# Patient Record
Sex: Male | Born: 1935 | Hispanic: Refuse to answer | Marital: Married
Health system: Midwestern US, Academic
[De-identification: ages and names within clinical notes are randomized; demographics above are authoritative.]

---

## 2017-06-25 ENCOUNTER — Encounter: Admit: 2017-06-25 | Discharge: 2017-06-25 | Payer: MEDICARE

## 2017-06-25 NOTE — Progress Notes
Records Request    Medical records request for continuation of care:    Patient has appointment on 07/09/17   with  Dr. Trudi Ida* .    Please fax records to [facility]  [phone]    Request records:    Most recent Emergency Room Visit    EKG's           Recent Cardiac Testing    Any cardiac-related records    Recent Labs    H&P/Discharge Summary         Thank you,      [facility]  [facility]  [address]  [place], [place] [address]  Phone:  [phone]  Fax:  [phone]

## 2017-06-25 NOTE — Progress Notes
Records Request    Medical records request for continuation of care:    Patient has appointment on 07/09/17   with  Dr. Trudi Ida* .    Please fax records to [facility]  [phone]    Request records:        Recent Labs         Thank you,      [facility]  [facility]  [address]  [place], [place] [address]  Phone:  [phone]  Fax:  [phone]

## 2017-07-09 ENCOUNTER — Encounter: Admit: 2017-07-09 | Discharge: 2017-07-09 | Payer: MEDICARE

## 2017-07-09 ENCOUNTER — Ambulatory Visit: Admit: 2017-07-09 | Discharge: 2017-07-10 | Payer: MEDICARE

## 2017-07-09 DIAGNOSIS — I10 Essential (primary) hypertension: ICD-10-CM

## 2017-07-09 DIAGNOSIS — I2510 Atherosclerotic heart disease of native coronary artery without angina pectoris: Secondary | ICD-10-CM

## 2017-07-09 DIAGNOSIS — R079 Chest pain, unspecified: ICD-10-CM

## 2017-07-09 DIAGNOSIS — E782 Mixed hyperlipidemia: ICD-10-CM

## 2017-07-09 DIAGNOSIS — E785 Hyperlipidemia, unspecified: ICD-10-CM

## 2018-05-15 LAB — LIPID PROFILE
Lab: 122 — ABNORMAL HIGH (ref <100)
Lab: 168
Lab: 205 — ABNORMAL HIGH (ref <201)
Lab: 39 — ABNORMAL LOW (ref 40–60)

## 2018-05-15 LAB — BASIC METABOLIC PANEL
Lab: 1.6 — ABNORMAL HIGH (ref 0.55–1.30)
Lab: 107
Lab: 141
Lab: 27

## 2018-05-15 LAB — THYROID STIMULATING HORMONE-TSH: Lab: 1.9

## 2018-05-15 LAB — PROSTATIC SPECIFIC ANTIGEN-PSA: Lab: 1 — ABNORMAL HIGH (ref 8–26)

## 2018-07-29 ENCOUNTER — Encounter: Admit: 2018-07-29 | Discharge: 2018-07-29 | Payer: MEDICARE

## 2018-08-19 ENCOUNTER — Encounter: Admit: 2018-08-19 | Discharge: 2018-08-19 | Payer: MEDICARE

## 2018-08-19 ENCOUNTER — Ambulatory Visit: Admit: 2018-08-19 | Discharge: 2018-08-20 | Payer: MEDICARE

## 2018-08-19 DIAGNOSIS — I2510 Atherosclerotic heart disease of native coronary artery without angina pectoris: ICD-10-CM

## 2018-08-19 DIAGNOSIS — E785 Hyperlipidemia, unspecified: ICD-10-CM

## 2018-08-19 DIAGNOSIS — I10 Essential (primary) hypertension: ICD-10-CM

## 2018-08-19 DIAGNOSIS — E782 Mixed hyperlipidemia: ICD-10-CM

## 2018-08-19 DIAGNOSIS — R079 Chest pain, unspecified: ICD-10-CM

## 2018-08-21 ENCOUNTER — Encounter: Admit: 2018-08-21 | Discharge: 2018-08-21 | Payer: MEDICARE

## 2018-09-08 ENCOUNTER — Ambulatory Visit: Admit: 2018-09-08 | Discharge: 2018-09-09 | Payer: MEDICARE

## 2018-09-08 ENCOUNTER — Encounter: Admit: 2018-09-08 | Discharge: 2018-09-08 | Payer: MEDICARE

## 2018-09-08 DIAGNOSIS — I10 Essential (primary) hypertension: ICD-10-CM

## 2018-09-08 DIAGNOSIS — I2510 Atherosclerotic heart disease of native coronary artery without angina pectoris: Principal | ICD-10-CM

## 2018-10-14 ENCOUNTER — Encounter: Admit: 2018-10-14 | Discharge: 2018-10-14 | Payer: MEDICARE

## 2019-08-25 ENCOUNTER — Encounter: Admit: 2019-08-25 | Discharge: 2019-08-25 | Payer: MEDICARE

## 2019-08-25 DIAGNOSIS — I2510 Atherosclerotic heart disease of native coronary artery without angina pectoris: Secondary | ICD-10-CM

## 2019-08-25 DIAGNOSIS — R079 Chest pain, unspecified: Secondary | ICD-10-CM

## 2019-08-25 DIAGNOSIS — I10 Essential (primary) hypertension: Secondary | ICD-10-CM

## 2019-08-25 DIAGNOSIS — E782 Mixed hyperlipidemia: Secondary | ICD-10-CM

## 2019-08-25 DIAGNOSIS — E785 Hyperlipidemia, unspecified: Secondary | ICD-10-CM

## 2019-08-25 NOTE — Patient Instructions
Thank you for visiting our office today.    Continue the same medications as you have been doing.          We will be pursuing the following tests after your appointment today:        Lab work    We will plan to see you back in 12 months.  Please call us in the meantime with any questions or concerns.        Please allow 5-7 business days for our providers to review your results. All normal results will go to MyChart. If you do not have Mychart, it is strongly recommended to get this so you can easily view all your results. If you do not have mychart, we will attempt to call you once with normal lab and testing results. If we cannot reach you by phone with normal results, we will send you a letter.  If you have not heard the results of your testing after one week please give [place] a call.       Your Cardiovascular Medicine [facility] Team Richardson Landry, Rene Kocher and [facility])  phone number is [phone].        Please keep a log of blood pressures and report back in one month.

## 2019-08-25 NOTE — Progress Notes
Date of Service: 08/25/2019    Donald Benton is a 85 y.o. male.       HPI   Donald Benton is followed for coronary artery disease, hypertension and hypercholesterolemia.  He presented with chest discomfort in 2005 and reportedly had stress testing at that time which was abnormal.  He then underwent coronary angiography and received several stents to his left anterior descending coronary artery.  He indicates that he has been stable over the past year.  Donald Benton reports no febrile or infectious symptoms during the Covid pandemic. The patient has been doing well and reports no angina, congestive symptoms, palpitations, sensation of sustained forceful heart pounding, lightheadedness or syncope.  His exercise tolerance has been stable.  He is still active as a farmer but otherwise does not have a regular exercise program.  The patient reports no myalgias, bleeding abnormalities, neurologic motor abnormalities or difficulty with speech.        Vitals:    08/25/19 0822 08/25/19 0837   BP: (!) 140/80 (!) 142/80   BP Source: Arm, Left Upper Arm, Right Upper   Pulse: 66    Temp: 36.9 ?C (98.4 ?F)    SpO2: 98%    Weight: 84.6 kg (186 lb 9.6 oz)    Height: 1.829 m (6')    PainSc: Zero      Body mass index is 25.31 kg/m?Marland Kitchen     Past Medical History  Patient Active Problem List    Diagnosis Date Noted   ? Essential hypertension with goal blood pressure less than 140/90 06/21/2015   ? Coronary artery disease involving native coronary artery of native heart without angina pectoris 07/11/2009     Cath 11/23/03 - PCI of LAD with 3 stents     ? Chest pain 07/11/2009   ? Hypertension 07/11/2009   ? Hyperlipidemia 07/11/2009         Review of Systems   Constitution: Negative.   HENT: Negative.    Eyes: Negative.    Cardiovascular: Negative.    Respiratory: Negative.    Endocrine: Negative.    Hematologic/Lymphatic: Negative.    Skin: Negative.    Musculoskeletal: Negative.    Gastrointestinal: Negative.    Genitourinary: Negative. Neurological: Negative.    Psychiatric/Behavioral: Negative.    Allergic/Immunologic: Negative.        Physical Exam  GENERAL: The patient is well developed, well nourished, resting comfortably and in no distress.   HEENT: No abnormalities of the visible oro-nasopharynx, conjunctiva or sclera are noted.  NECK: There is no jugular venous distension. Carotids are palpable and without bruits. There is no thyroid enlargement.  Chest: Lung fields are clear to auscultation. There are no wheezes or crackles.   CV: There is a regular rhythm. The first and second heart sounds are normal. There are no murmurs, gallops or rubs.  His apical heart rate is 60 bpm.  ABD: The abdomen is soft and supple with normal bowel sounds. There is no hepatosplenomegaly, ascites, tenderness, masses or bruits.  Neuro: There are no focal motor defects. Ambulation is normal. Cognitive function appears normal.  Ext: There is no edema or evidence of deep vein thrombosis. Peripheral pulses are satisfactory.    SKIN: There are no rashes and no cellulitis  PSYCH: The patient is calm, rationale and oriented.    Cardiovascular Studies  A twelve-lead ECG obtained on 08/25/2019 revealed normal sinus rhythm with a heart rate of 58 bpm.  There is no evidence of myocardial ischemia or  infarction.    Echo Doppler 09/08/2018:  Left ventricular systolic function is within normal limits.  LVEF 60-65%  No significant valvular abnormalities.  No pericardial effusion.   Estimated peak systolic pulmonary artery pressure is 22 mmHg.     Problems Addressed Today  Encounter Diagnoses   Name Primary?   ? Coronary artery disease involving native coronary artery of native heart without angina pectoris Yes   ? Chest pain, unspecified type    ? Essential hypertension    ? Mixed hyperlipidemia        Assessment and Plan     [name] is doing well from a cardiovascular perspective.  He reports no angina or congestive symptoms.  His blood pressure is borderline elevated today. I have asked the patient to keep a log book of his BP readings and to report BP readings exceeding 130/80 mm Hg.  I have asked him to report his blood pressure readings within 1 month regardless. The patient was given a requisition to check a fasting lipid profile, ALT, and Chem 7.  I have asked Mr. Gerstenberger to return for follow-up in 12 months.         Current Medications (including today's revisions)  ? ASPIRIN (BAYER LOW STRENGTH PO) Take 81 mg by mouth Daily.   ? atorvastatin (LIPITOR) 20 mg tablet Take 20 mg by mouth.   ? losartan-hydrochlorothiazide (HYZAAR) 100-25 mg tablet Take 1 Tab by mouth daily.   ? metoprolol (LOPRESSOR) 50 mg tablet Take 1 Tab by mouth twice daily.   ? nitroglycerin (NITROSTAT) 0.4 mg SL tablet Place 0.4 mg under tongue Every 5 Min as needed for Chest Pain.

## 2021-11-24 IMAGING — CT PELWO
3 of 4 series · 13 of 35 positions shown, 16 images · non-contrast
Comparison: none

[Series 4: pelvis ax 5.00 br40 s3 · axial · 0.57mm/px · z∈[+1240,+1430]mm · 5 of 58 slices shown, 7 images]
[im 10/58  soft-tissue]
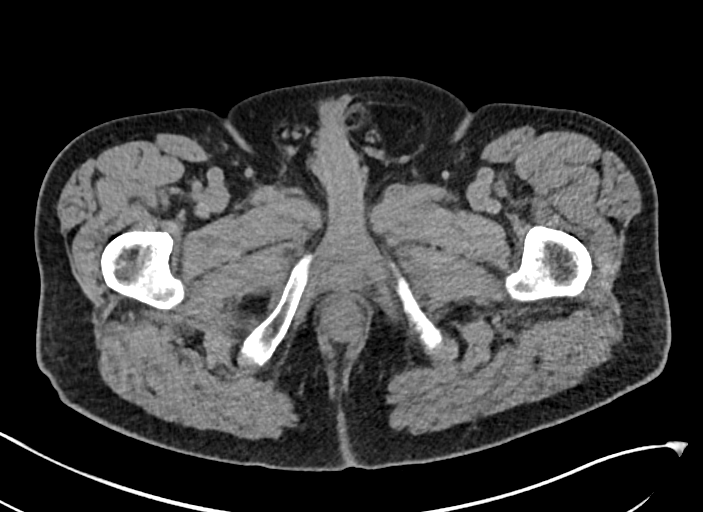
[im 10/58  bone]
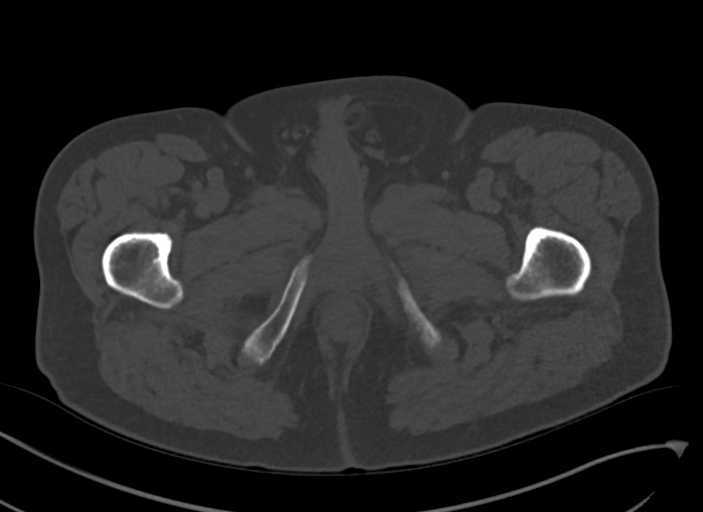
[im 20/58  bone]
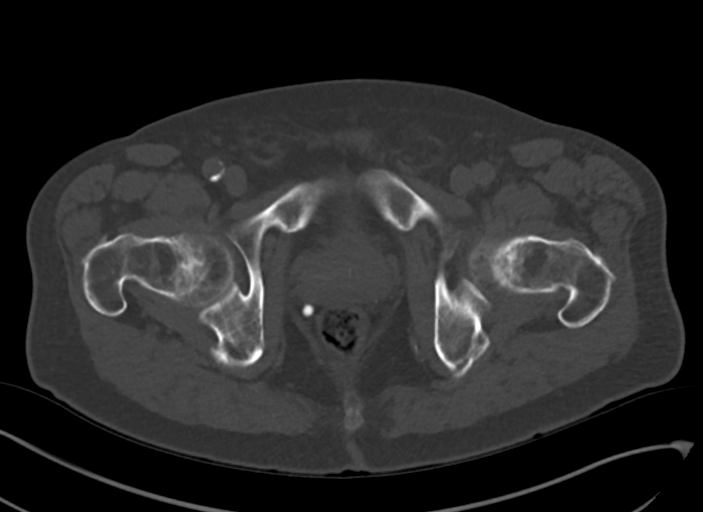
[im 29/58  bone]
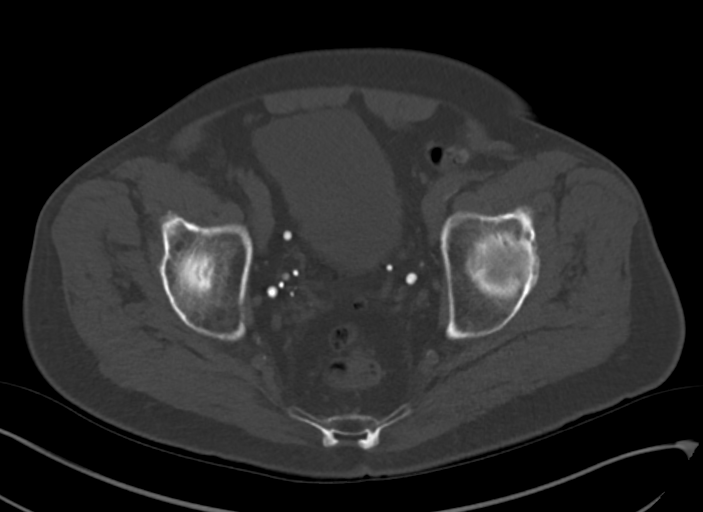
[im 39/58  bone]
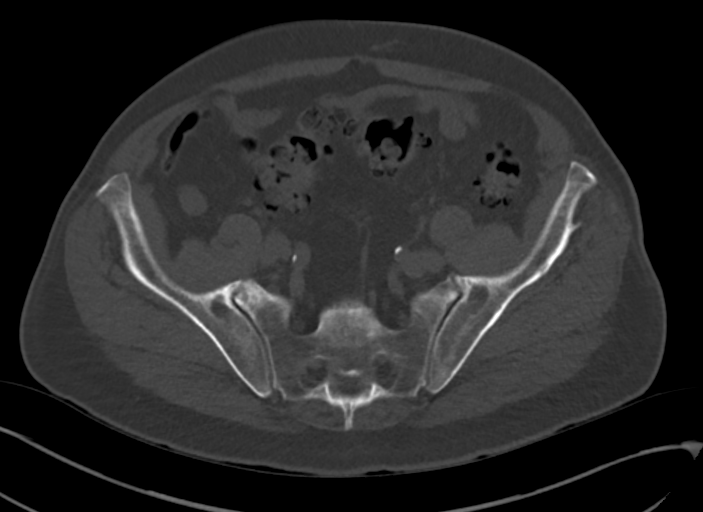
[im 48/58  soft-tissue]
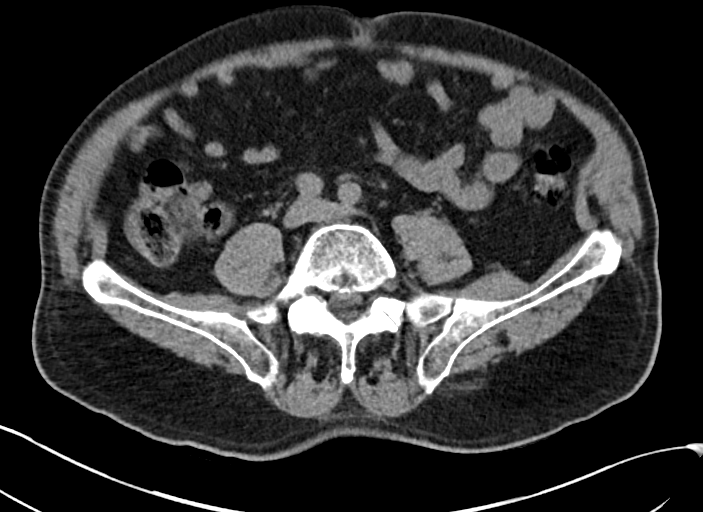
[im 48/58  bone]
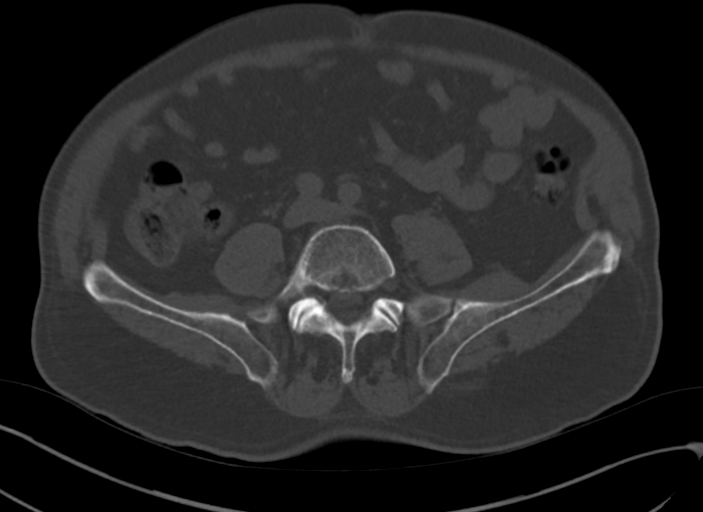

[Series 5: pelvis cor 5.00 br40 s3 · coronal · 0.57mm/px · 3 of 58 slices shown]
[im 12/58  bone]
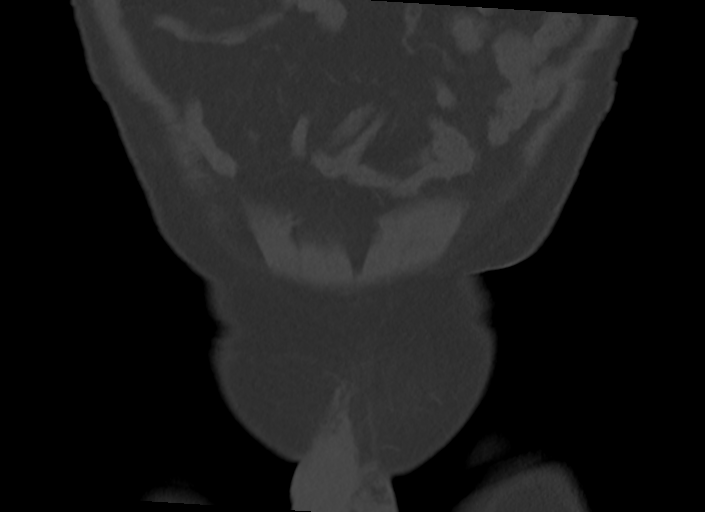
[im 23/58  bone]
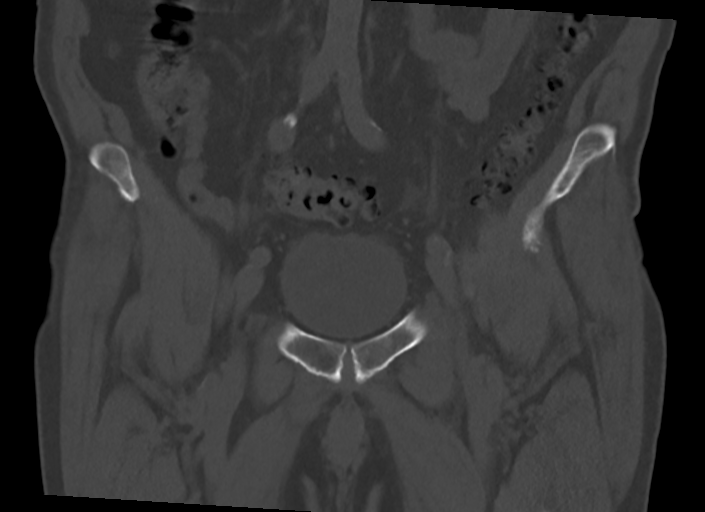
[im 35/58  bone]
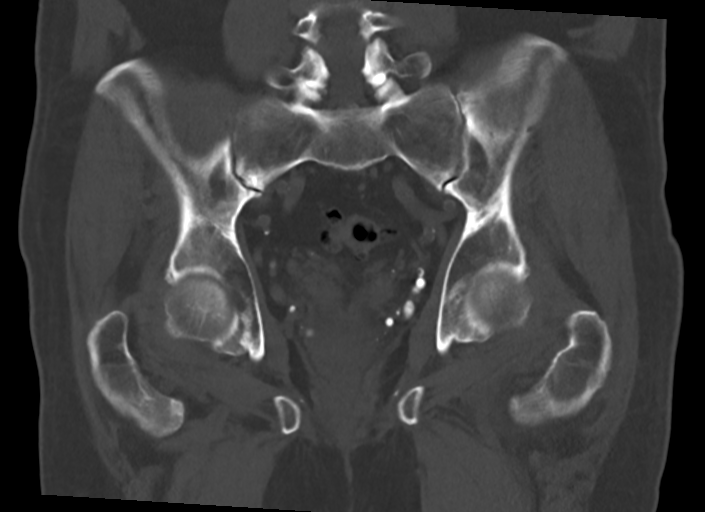

[Series 6: pelvis sag 5.00 br40 s3 · sagittal · 0.57mm/px · 5 of 80 slices shown, 6 images]
[im 27/80  bone]
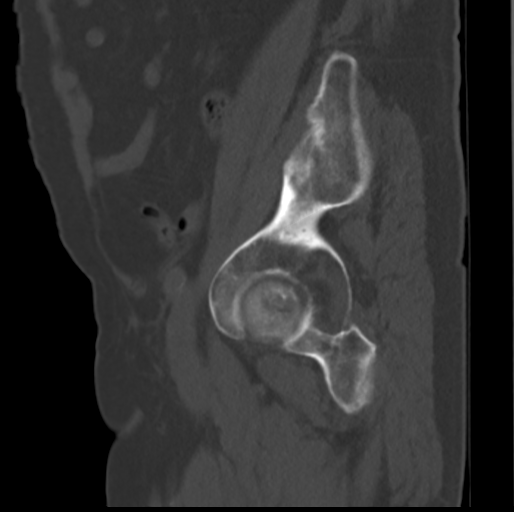
[im 33/80  bone]
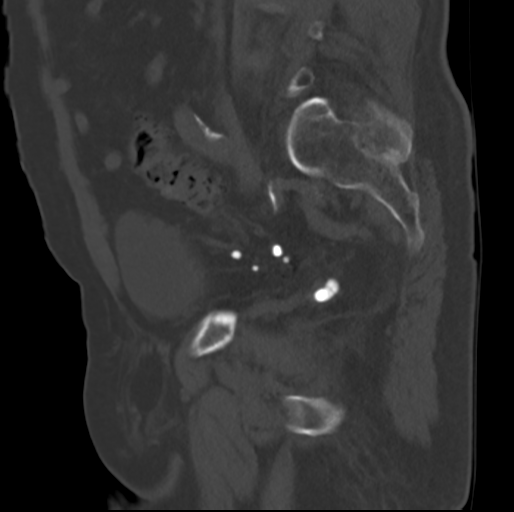
[im 40/80  soft-tissue]
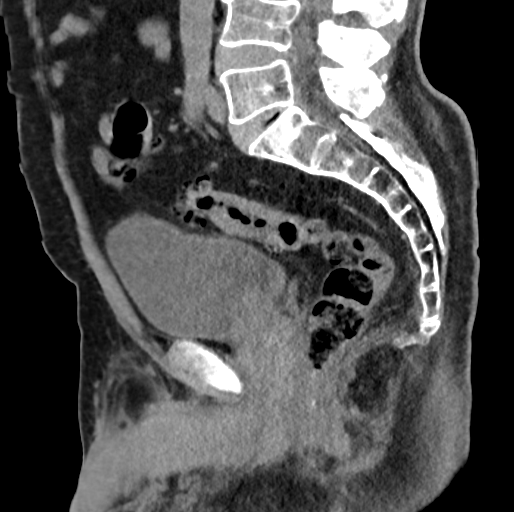
[im 40/80  bone]
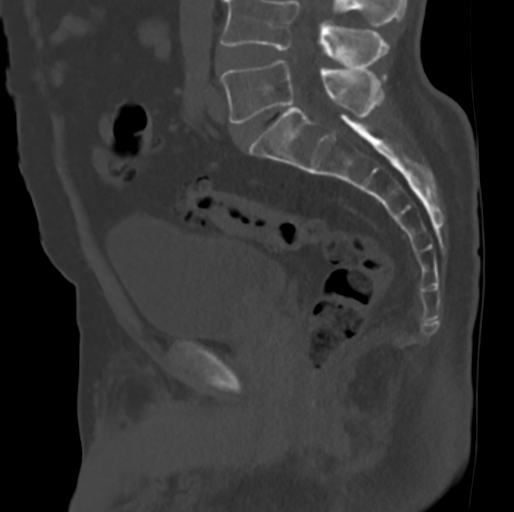
[im 47/80  bone]
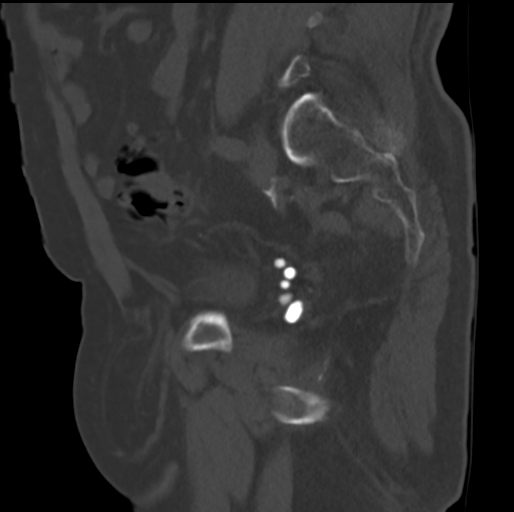
[im 53/80  bone]
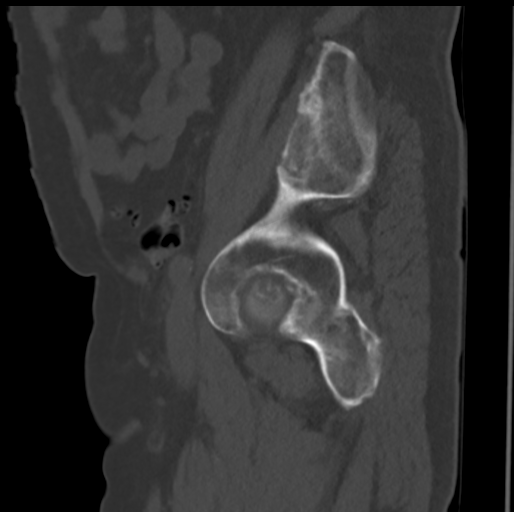

[13 of 35 positions shown; findings below may reference images not displayed]

DIAGNOSTIC STUDIES

EXAM

CT pelvis wo con

INDICATION

pelvic pain
fall last night landed on left side, lower back and pelvic pain  ct/nm 0/0

TECHNIQUE

All CT scans at this facility use dose modulation, iterative reconstruction, and/or weight based
dosing when appropriate to reduce radiation dose to as low as reasonably achievable.

Number of previous computed tomography exams in the last 12 months is 0  .

Number of previous nuclear medicine myocardial perfusion studies in the last 12 months is 0  .

COMPARISONS

None available.

FINDINGS

There is no fracture dislocation of the pelvis. Chronic appearing posttraumatic change involving
the left ASIS. Mild bilateral hip osteoarthritis with small osteophytes present. Moderate left
inguinal fat containing hernia. Large amount of sigmoid diverticulosis. Pelvic phleboliths.

IMPRESSION

No acute osseous findings in the pelvis.

Tech Notes:

fall last night landed on left side, lower back and pelvic pain
ct/nm 0/0

## 2023-11-23 IMAGING — CR [ID]
2 series · 2 of 2 positions shown · non-contrast
Comparison: none

[knee ap]
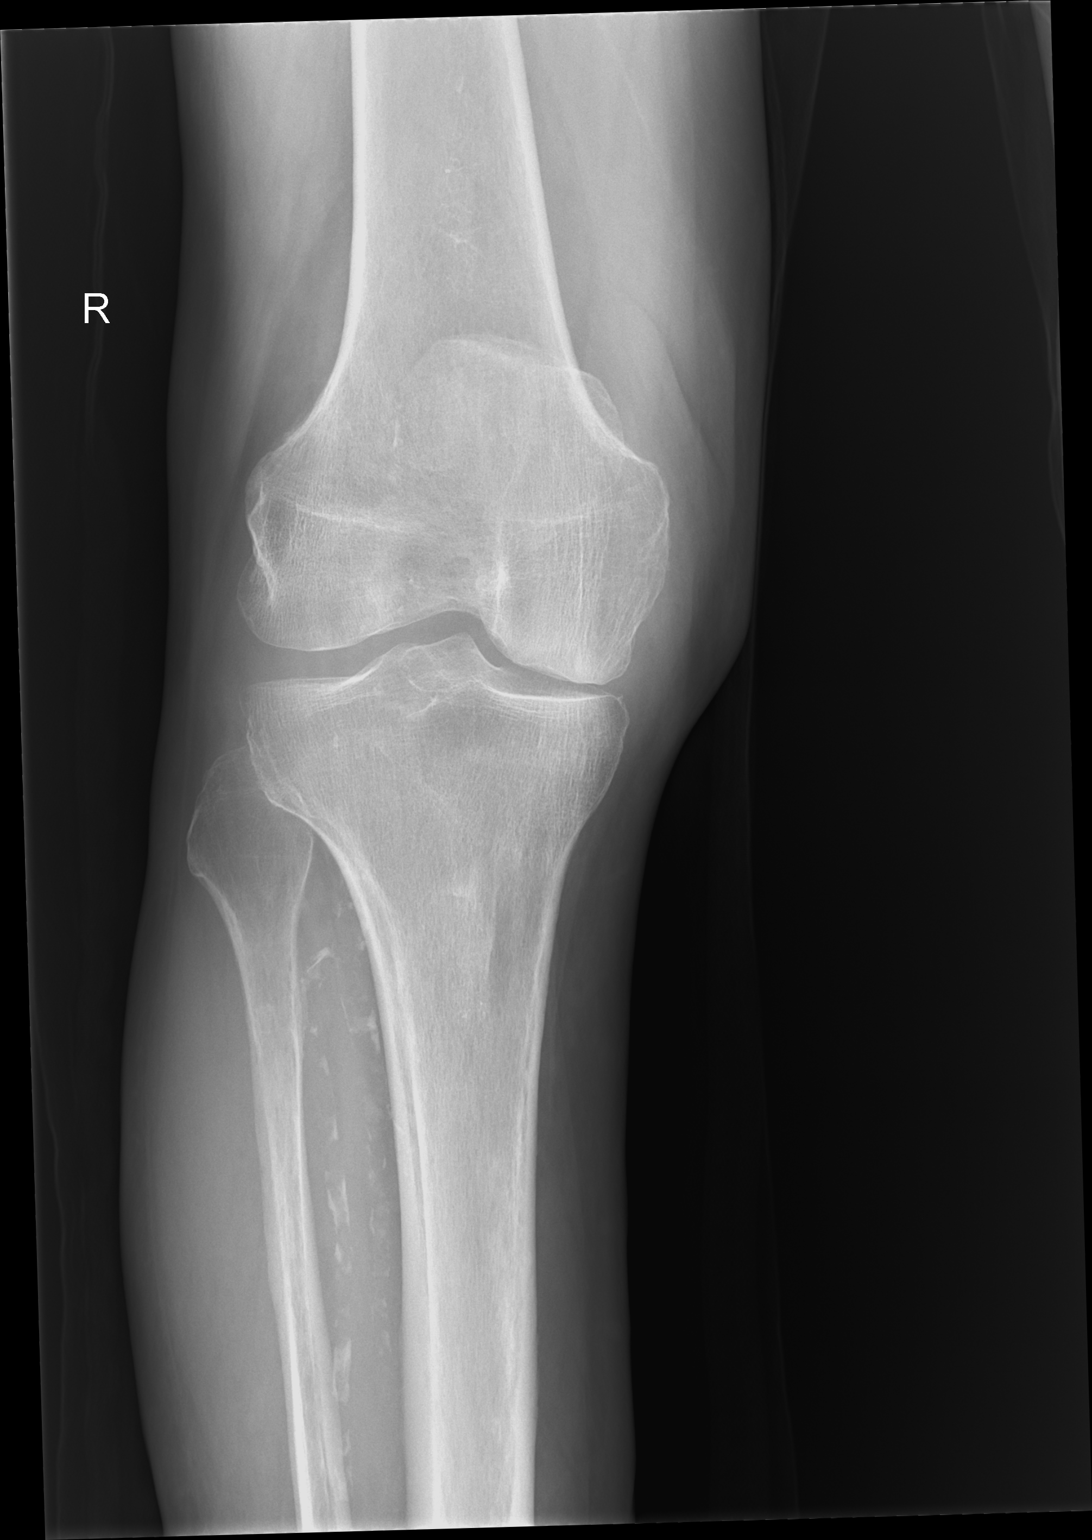

[knee lat]
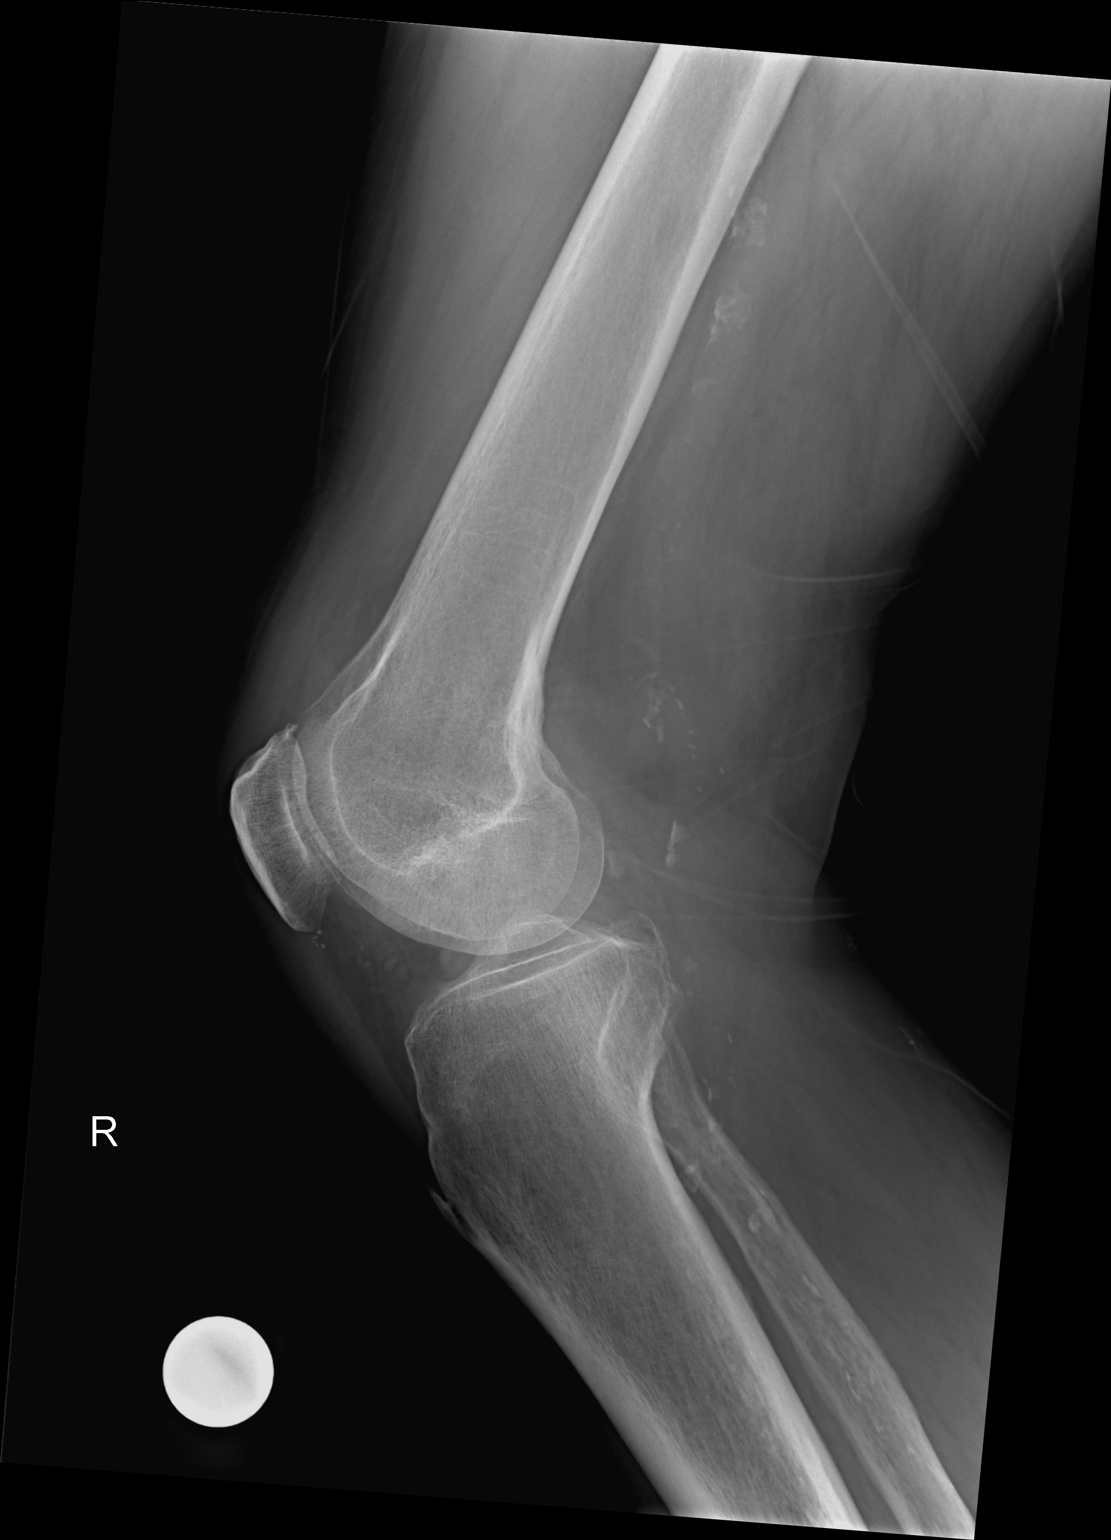

[2 of 2 positions shown; findings below may reference images not displayed]

DIAGNOSTIC STUDIES

EXAM

XR knee RT 3V

INDICATION

rt knee pain
Right knee pain, no known injury.

TECHNIQUE

AP lateral patellar views

COMPARISONS

June 20, 2001

FINDINGS

Moderate medial compartment narrowing is re-demonstrated. There is adjacent vascular calcification.
There is narrowing of the lateral patellar femoral set and spurring off the superior articular facet
of the patella. Trace knee effusion is seen

IMPRESSION

Continued degenerative changes without significant change from prior exam. Small knee effusion is
seen.

Tech Notes:

Right knee pain, no known injury.

## 2024-11-05 DEATH — deceased
